# Patient Record
Sex: Female | Born: 1985 | Race: White | Hispanic: No | Marital: Married | Smoking: Former smoker
Health system: Southern US, Community
[De-identification: ages and names within clinical notes are randomized; demographics above are authoritative.]

## PROBLEM LIST (undated history)

## (undated) DIAGNOSIS — F32A Depression, unspecified: Secondary | ICD-10-CM

## (undated) DIAGNOSIS — F988 Other specified behavioral and emotional disorders with onset usually occurring in childhood and adolescence: Secondary | ICD-10-CM

## (undated) DIAGNOSIS — F419 Anxiety disorder, unspecified: Secondary | ICD-10-CM

## (undated) DIAGNOSIS — F329 Major depressive disorder, single episode, unspecified: Secondary | ICD-10-CM

---

## 2016-07-15 ENCOUNTER — Ambulatory Visit
Admission: EM | Admit: 2016-07-15 | Discharge: 2016-07-15 | Disposition: A | Discharge status: Home / Self Care | Payer: Medicaid Other | Attending: Family Medicine | Admitting: Family Medicine

## 2016-07-15 DIAGNOSIS — F17210 Nicotine dependence, cigarettes, uncomplicated: Secondary | ICD-10-CM | POA: Diagnosis not present

## 2016-07-15 DIAGNOSIS — N390 Urinary tract infection, site not specified: Secondary | ICD-10-CM | POA: Diagnosis not present

## 2016-07-15 DIAGNOSIS — F329 Major depressive disorder, single episode, unspecified: Secondary | ICD-10-CM | POA: Insufficient documentation

## 2016-07-15 DIAGNOSIS — R3129 Other microscopic hematuria: Secondary | ICD-10-CM | POA: Diagnosis not present

## 2016-07-15 DIAGNOSIS — Z9889 Other specified postprocedural states: Secondary | ICD-10-CM | POA: Insufficient documentation

## 2016-07-15 DIAGNOSIS — F419 Anxiety disorder, unspecified: Secondary | ICD-10-CM | POA: Insufficient documentation

## 2016-07-15 DIAGNOSIS — Z79899 Other long term (current) drug therapy: Secondary | ICD-10-CM | POA: Diagnosis not present

## 2016-07-15 HISTORY — DX: Depression, unspecified: F32.A

## 2016-07-15 HISTORY — DX: Major depressive disorder, single episode, unspecified: F32.9

## 2016-07-15 HISTORY — DX: Other specified behavioral and emotional disorders with onset usually occurring in childhood and adolescence: F98.8

## 2016-07-15 HISTORY — DX: Anxiety disorder, unspecified: F41.9

## 2016-07-15 LAB — URINALYSIS COMPLETE WITH MICROSCOPIC (ARMC ONLY)
BILIRUBIN URINE: NEGATIVE
Glucose, UA: NEGATIVE mg/dL
KETONES UR: NEGATIVE mg/dL
NITRITE: NEGATIVE
PH: 5.5 (ref 5.0–8.0)
PROTEIN: NEGATIVE mg/dL

## 2016-07-15 MED ORDER — PHENAZOPYRIDINE HCL 200 MG PO TABS: 200.0000 mg | ORAL_TABLET | Freq: Three times a day (TID) | ORAL | 0 refills | Status: DC

## 2016-07-15 MED ORDER — SULFAMETHOXAZOLE-TRIMETHOPRIM 800-160 MG PO TABS: 1.0000 | ORAL_TABLET | Freq: Two times a day (BID) | ORAL | 0 refills | Status: DC

## 2016-07-15 NOTE — ED Triage Notes (Signed)
Started yesterday with frequency and burning with urination starting yesterday. Pain 2/10 currently. Denies back pain.

## 2016-07-15 NOTE — ED Provider Notes (Signed)
CSN: [id number]     Arrival date & time 07/15/16  1515 History   First MD Initiated Contact with Patient 07/15/16 1553     Chief Complaint  Patient presents with  . Urinary Frequency   (Consider location/radiation/quality/duration/timing/severity/associated sxs/prior Treatment) Single caucasian female here for urinary frequency and burning x 2 days.  Last UTI years ago.  Denied personal or family history kidney stones.  Denied gross hematuria, changes in smell or color of urine, back pain, headache, diarrhea.  +low abdomen pain      Past Medical History:  Diagnosis Date  . ADD (attention deficit disorder)   . Anxiety   . Depression    Past Surgical History:  Procedure Laterality Date  . CESAREAN SECTION     History reviewed. No pertinent family history. Social History  Substance Use Topics  . Smoking status: Current Every Day Smoker    Packs/day: 0.50    Types: Cigarettes  . Smokeless tobacco: Never Used  . Alcohol use Yes     Comment: social   OB History    No data available     Review of Systems  Constitutional: Negative for activity change, appetite change, chills, diaphoresis, fatigue and fever.  HENT: Negative for congestion, ear pain and sore throat.   Eyes: Negative for photophobia, pain, discharge, redness, itching and visual disturbance.  Respiratory: Negative for cough, shortness of breath, wheezing and stridor.   Cardiovascular: Negative for chest pain, palpitations and leg swelling.  Gastrointestinal: Positive for abdominal pain. Negative for abdominal distention, anal bleeding, blood in stool, constipation, diarrhea, nausea, rectal pain and vomiting.  Endocrine: Positive for polyuria. Negative for polydipsia and polyphagia.  Genitourinary: Negative for dysuria and hematuria.  Musculoskeletal: Negative for arthralgias, back pain, gait problem, joint swelling, myalgias, neck pain and neck stiffness.  Skin: Negative for color change, rash and wound.   Allergic/Immunologic: Negative for environmental allergies and food allergies.  Neurological: Negative for dizziness, tremors, seizures, syncope, facial asymmetry, speech difficulty, weakness, light-headedness, numbness and headaches.  Hematological: Negative for adenopathy. Does not bruise/bleed easily.  Psychiatric/Behavioral: Negative for sleep disturbance.  All other systems reviewed and are negative.   Allergies  Review of patient's allergies indicates no known allergies.  Home Medications   Prior to Admission medications   Medication Sig Start Date End Date Taking? Authorizing Provider  ALPRAZolam Prudy Feeler(XANAX) 0.5 MG tablet Take 0.5 mg by mouth at bedtime as needed for anxiety.   Yes Historical Provider, MD  amphetamine-dextroamphetamine (ADDERALL) 20 MG tablet Take 20 mg by mouth daily.   Yes Historical Provider, MD  citalopram (CELEXA) 40 MG tablet Take 40 mg by mouth daily.   Yes Historical Provider, MD  Melatonin 5 MG TABS Take by mouth.   Yes Historical Provider, MD  phenazopyridine (PYRIDIUM) 200 MG tablet Take 1 tablet (200 mg total) by mouth 3 (three) times daily. 07/15/16   Barbaraann Barthelina A Camilo Mander, NP  sulfamethoxazole-trimethoprim (BACTRIM DS,SEPTRA DS) 800-160 MG tablet Take 1 tablet by mouth 2 (two) times daily. 07/15/16   Barbaraann Barthelina A Chelsea Nusz, NP   Meds Ordered and Administered this Visit  Medications - No data to display  BP 133/88   Pulse 72   Temp 97.3 F (36.3 C) (Tympanic)   Resp 18   Ht 5\' 7"  (1.702 m)   Wt 197 lb (89.4 kg)   SpO2 100%   BMI 30.85 kg/m  No data found.   Physical Exam  Constitutional: She appears well-developed and well-nourished. No distress.  HENT:  Head:  Normocephalic and atraumatic.  Right Ear: External ear normal.  Left Ear: External ear normal.  Nose: Nose normal.  Mouth/Throat: Oropharynx is clear and moist. No oropharyngeal exudate.  Eyes: Conjunctivae and EOM are normal. Pupils are equal, round, and reactive to light. Right eye exhibits  no discharge. Left eye exhibits no discharge. No scleral icterus.  Neck: Normal range of motion. Neck supple. No tracheal deviation present. No thyromegaly present.  Cardiovascular: Normal rate and regular rhythm.   No murmur heard. Pulmonary/Chest: Effort normal and breath sounds normal. No stridor. No respiratory distress. She has no wheezes. She has no rales.  Abdominal: Soft. She exhibits no distension and no mass. There is tenderness. There is no rebound and no guarding. No hernia.  Musculoskeletal: Normal range of motion. She exhibits no edema, tenderness or deformity.  Lymphadenopathy:    She has no cervical adenopathy.  Neurological: She is alert.  Skin: Skin is warm and dry. Capillary refill takes less than 2 seconds. No rash noted. She is not diaphoretic. No erythema. No pallor.  Psychiatric: She has a normal mood and affect. Her behavior is normal. Judgment and thought content normal.  Nursing note and vitals reviewed.   Urgent Care Course   Clinical Course    Procedures (including critical care time)  Labs Review Labs Reviewed  URINALYSIS COMPLETEWITH MICROSCOPIC ([facility] ONLY) - Abnormal; Notable for the following:       Result Value   Color, Urine STRAW (*)    Specific Gravity, Urine <1.005 (*)    Hgb urine dipstick SMALL (*)    Leukocytes, UA MODERATE (*)    Bacteria, UA RARE (*)    Squamous Epithelial / LPF 0-5 (*)    All other components within normal limits  URINE CULTURE    Imaging Review No results found.  1602 urinalysis results pending patient sipping water in exam room  1625 reviewed + blood and bacteria urinalysis results with patient and given copy.  Discussed urine culture results typically available 48 hours will call with results once available.  Patient verbalized understanding information/instructions, agreed with plan of care and had no further questions at this time.  MDM   1. UTI (lower urinary tract infection)   2. Microscopic hematuria     Medications as directed.  Bactrim ds po BID x 7 days.  Rx given. Ph lower than 6 so held on macrobid.  Patient is also to push fluids and may use Pyridium 200mg  po TID as needed x 48 hours.  Hydrate, avoid dehydration.  Avoid holding urine void on frequent basis every 4 to 6 hours.  If unable to void every 8 hours follow up for re-evaluation with PCM, urgent care or ER.   Call or return to clinic as needed if these symptoms worsen or fail to improve as anticipated.  Exitcare handout on cystitis, urinalysis given to patient Patient verbalized agreement and understanding of treatment plan and had no further questions at this time. P2:  Hydrate and cranberry juice    Barbaraann Barthel, NP 07/15/16 1726

## 2016-07-18 LAB — URINE CULTURE
Culture: >=100000 — AB
SPECIAL REQUESTS: NORMAL

## 2016-07-21 ENCOUNTER — Telehealth: Payer: Self-pay | Admitting: Internal Medicine

## 2016-07-21 NOTE — Telephone Encounter (Signed)
Urine culture 100,000 e.coli no resistance noted to antibiotics.  Patient to finish her Rx.  Notified via telephone she stated she was feeling better, verbalized understanding information/instructions, agreed with plan of care and had no futher questions at this time.

## 2016-07-22 ENCOUNTER — Telehealth (HOSPITAL_COMMUNITY): Payer: Self-pay | Admitting: Emergency Medicine

## 2016-07-22 NOTE — Telephone Encounter (Signed)
-----   Message from Stacey MooreLaura W Murray, MD sent at 07/18/2016  9:58 PM EDT ----- Please let patient know that urine culture was positive for E coli sensitive to trimethoprim/sulfa.  [facility]  Recheck or followup PCP Jola BabinskiWalter Fowler for further evaluation if symptoms persist.  LM

## 2016-07-22 NOTE — Telephone Encounter (Signed)
LM on pt's VM Need to see how pt is doing and to give lab results from recent visit on 9/13 Also let pt know labs can be obtained from MyChart  Pt has been notified by Albina Billetina Betancourt, NP per telephone note on 9/19

## 2019-08-17 ENCOUNTER — Other Ambulatory Visit: Payer: Self-pay

## 2019-08-17 ENCOUNTER — Ambulatory Visit: Payer: Medicaid Other

## 2019-08-17 ENCOUNTER — Ambulatory Visit
Admission: EM | Admit: 2019-08-17 | Discharge: 2019-08-17 | Disposition: A | Discharge status: Home / Self Care | Payer: Medicaid Other | Attending: Family Medicine | Admitting: Family Medicine

## 2019-08-17 DIAGNOSIS — S62102A Fracture of unspecified carpal bone, left wrist, initial encounter for closed fracture: Secondary | ICD-10-CM | POA: Diagnosis not present

## 2019-08-17 DIAGNOSIS — W19XXXA Unspecified fall, initial encounter: Secondary | ICD-10-CM | POA: Diagnosis not present

## 2019-08-17 LAB — URINALYSIS, COMPLETE (UACMP) WITH MICROSCOPIC
Bilirubin Urine: NEGATIVE
Glucose, UA: NEGATIVE mg/dL
Ketones, ur: NEGATIVE mg/dL
Leukocytes,Ua: NEGATIVE
Nitrite: NEGATIVE
Protein, ur: NEGATIVE mg/dL
Specific Gravity, Urine: 1.02 (ref 1.005–1.030)
pH: 6 (ref 5.0–8.0)

## 2019-08-17 MED ORDER — IBUPROFEN 800 MG PO TABS: 800.0000 mg | ORAL_TABLET | Freq: Three times a day (TID) | ORAL | 0 refills | Status: AC | PRN

## 2019-08-17 NOTE — Discharge Instructions (Addendum)
Rest, ice, elevation.  Ibuprofen as directed.   Splint for 2 weeks.  Take care  Dr. Lacinda Axon

## 2019-08-17 NOTE — ED Triage Notes (Signed)
Patient states that she fell last night. Patient reports that she has been having pain in left thumb since, swollen.

## 2019-08-17 NOTE — ED Provider Notes (Signed)
[facility]    CSN: [id number] Arrival date & time: 08/17/19  1544  History   Chief Complaint Chief Complaint  Patient presents with  . Fall  . Finger Injury    left thumb   HPI  33 year old female presents with a left thumb injury.  Patient recently delivered on 10/2.  Patient states that she had put her baby down and subsequently tripped on her feet and fell forward on outstretched hand.  She reports left thumb pain.  Associated swelling particular at the thenar eminence.  Rates her pain as 2/10 in severity.  Worse with palpation.  No relieving factors.  No other associated symptoms.  No other complaints.   PMH, Surgical Hx, Family Hx, Social History reviewed and updated as below.  Past Medical History:  Diagnosis Date  . ADD (attention deficit disorder)   . Anxiety   . Depression   GERD Hx of IUGR  Past Surgical History:  Procedure Laterality Date  . CESAREAN SECTION     OB History   No obstetric history on file.    Home Medications    Prior to Admission medications   Medication Sig Start Date End Date Taking? Authorizing Provider  citalopram (CELEXA) 20 MG tablet Take 20 mg by mouth daily.    Yes [provider]  labetalol (NORMODYNE) 200 MG tablet Take by mouth. 08/06/19 08/05/20 Yes [provider]  Melatonin 5 MG TABS Take by mouth.   Yes [provider]  ibuprofen (ADVIL) 800 MG tablet Take 1 tablet (800 mg total) by mouth every 8 (eight) hours as needed for moderate pain. 08/17/19   Coral Spikes, DO  amphetamine-dextroamphetamine (ADDERALL) 20 MG tablet Take 20 mg by mouth daily.  08/17/19  [provider]   Social History Social History   Tobacco Use  . Smoking status: Former Smoker    Packs/day: 0.50    Types: Cigarettes  . Smokeless tobacco: Never Used  Substance Use Topics  . Alcohol use: Yes    Comment: social  . Drug use: No     Allergies   Patient has no known allergies.   Review of  Systems Review of Systems  Constitutional: Negative.   Musculoskeletal:       Thumb pain, swelling, injury.   Physical Exam Triage Vital Signs ED Triage Vitals  Enc Vitals Group     BP 08/17/19 1608 (!) 126/93     Pulse Rate 08/17/19 1608 78     Resp 08/17/19 1608 18     Temp 08/17/19 1608 99.3 F (37.4 C)     Temp Source 08/17/19 1608 Oral     SpO2 08/17/19 1608 100 %     Weight --      Height 08/17/19 1604 5\' 7"  (1.702 m)     Head Circumference --      Peak Flow --      Pain Score 08/17/19 1604 2     Pain Loc --      Pain Edu? --      Excl. in [place]? --    Updated Vital Signs BP (!) 126/93 (BP Location: Left Arm)   Pulse 78   Temp 99.3 F (37.4 C) (Oral)   Resp 18   Ht 5\' 7"  (1.702 m)   SpO2 100%   BMI 30.85 kg/m   Visual Acuity Right Eye Distance:   Left Eye Distance:   Bilateral Distance:    Right Eye Near:   Left Eye Near:  Bilateral Near:     Physical Exam Vitals signs and nursing note reviewed.  Constitutional:      General: She is not in acute distress.    Appearance: Normal appearance. She is not ill-appearing.  HENT:     Head: Normocephalic and atraumatic.  Eyes:     General:        Right eye: No discharge.        Left eye: No discharge.     Conjunctiva/sclera: Conjunctivae normal.  Cardiovascular:     Rate and Rhythm: Normal rate and regular rhythm.     Heart sounds: No murmur.  Pulmonary:     Effort: Pulmonary effort is normal.     Breath sounds: Normal breath sounds. No wheezing, rhonchi or rales.  Musculoskeletal:     Comments: Left thumb.  Swelling noted at the thenar eminence.  Tenderness palpation at the MCP joint.  Neurological:     Mental Status: She is alert.  Psychiatric:        Mood and Affect: Mood normal.        Behavior: Behavior normal.    UC Treatments / Results  Labs (all labs ordered are listed, but only abnormal results are displayed) Labs Reviewed  URINALYSIS, COMPLETE (UACMP) WITH MICROSCOPIC    EKG    Radiology Dg Finger Thumb Left  Result Date: 08/17/2019 CLINICAL DATA:  Left thumb pain and swelling.  Fall last night. EXAM: LEFT THUMB 2+V COMPARISON:  None. FINDINGS: There is a mildly displaced fracture of the ulnar sesamoid bone at the first MCP joint. The first metacarpal and phalanges of the thumb appear intact. There is no dislocation. Mild soft tissue swelling is noted about the first MCP joint. IMPRESSION: Fracture of the ulnar sesamoid of the thumb. Electronically Signed   By: Sebastian Ache M.D.   On: 08/17/2019 16:46    Procedures Procedures (including critical care time)  Medications Ordered in UC Medications - No data to display  Initial Impression / Assessment and Plan / UC Course  I have reviewed the triage vital signs and the nursing notes.  Pertinent labs & imaging results that were available during my care of the patient were reviewed by me and considered in my medical decision making (see chart for details).    33 year old female presents with a fracture of her sesamoid of the left thumb.  Placed in thumb spica.  Ibuprofen as directed.  Additionally, after I informed the patient regarding her fracture and treatment plan, she endorsed some painful urination.  Urinalysis was obtained.  Results pending at this time.  Final Clinical Impressions(s) / UC Diagnoses   Final diagnoses:  Closed fracture of sesamoid bone of left hand, initial encounter     Discharge Instructions     Rest, ice, elevation.  Ibuprofen as directed.   Splint for 2 weeks.  Take care  Dr. Adriana Simas     ED Prescriptions    Medication Sig Dispense Auth. Provider   ibuprofen (ADVIL) 800 MG tablet Take 1 tablet (800 mg total) by mouth every 8 (eight) hours as needed for moderate pain. 30 tablet Tommie Sams, DO     PDMP not reviewed this encounter.   Tommie Sams, [place] 08/17/19 1721

## 2019-08-18 LAB — URINE CULTURE

## 2020-07-26 ENCOUNTER — Ambulatory Visit
Admission: EM | Admit: 2020-07-26 | Discharge: 2020-07-26 | Disposition: A | Discharge status: Home / Self Care | Payer: Medicaid Other

## 2020-07-26 DIAGNOSIS — H00015 Hordeolum externum left lower eyelid: Secondary | ICD-10-CM

## 2020-07-26 MED ORDER — TOBRAMYCIN 0.3 % OP SOLN: 1.0000 [drp] | Freq: Four times a day (QID) | OPHTHALMIC | 0 refills | Status: AC

## 2020-07-26 NOTE — ED Triage Notes (Signed)
Patient in today w/ c/o stye on her left eye.

## 2020-07-26 NOTE — ED Provider Notes (Signed)
[facility]    CSN: [id number] Arrival date & time: 07/26/20  1332      History   Chief Complaint Chief Complaint  Patient presents with  . Stye    Left eye    HPI Stacey Goodman is a 34 y.o. female.   Stacey Goodman presents with complaints of crusting and painful redness and swelling to left lower lid. Noted today. Has had previous similar, has required antibiotic drops. No vision changes. No fevers. Doesn't wear contacts or glasses. Just started using a new underye moisturizer, no other known irritant or exposure.    ROS per HPI, negative if not otherwise mentioned.      Past Medical History:  Diagnosis Date  . ADD (attention deficit disorder)   . Anxiety   . Depression     There are no problems to display for this patient.   Past Surgical History:  Procedure Laterality Date  . CESAREAN SECTION      OB History   No obstetric history on file.      Home Medications    Prior to Admission medications   Medication Sig Start Date End Date Taking? Authorizing Provider  amphetamine-dextroamphetamine (ADDERALL XR) 20 MG 24 hr capsule Take by mouth. 07/02/20 08/01/20 Yes [provider]  citalopram (CELEXA) 20 MG tablet Take 20 mg by mouth daily.    Yes [provider]  ibuprofen (ADVIL) 800 MG tablet Take 1 tablet (800 mg total) by mouth every 8 (eight) hours as needed for moderate pain. 08/17/19  Yes Cook, Jayce G, DO  Melatonin 5 MG TABS Take by mouth.   Yes [provider]  labetalol (NORMODYNE) 200 MG tablet Take by mouth. 08/06/19 08/05/20  [provider]  tobramycin (TOBREX) 0.3 % ophthalmic solution Place 1 drop into the left eye every 6 (six) hours for 5 days. 07/26/20 07/31/20  Georgetta Haber, NP    Family History History reviewed. No pertinent family history.  Social History Social History   Tobacco Use  . Smoking status: Former Smoker    Packs/day: 0.50    Types: Cigarettes  .  Smokeless tobacco: Never Used  Substance Use Topics  . Alcohol use: Yes    Comment: social  . Drug use: No     Allergies   Patient has no known allergies.   Review of Systems Review of Systems   Physical Exam Triage Vital Signs ED Triage Vitals  Enc Vitals Group     BP 07/26/20 1351 (!) 177/129     Pulse Rate 07/26/20 1351 91     Resp 07/26/20 1351 16     Temp 07/26/20 1351 98.5 F (36.9 C)     Temp Source 07/26/20 1351 Oral     SpO2 07/26/20 1351 99 %     Weight 07/26/20 1354 230 lb (104.3 kg)     Height 07/26/20 1354 5\' 7"  (1.702 m)     Head Circumference --      Peak Flow --      Pain Score 07/26/20 1353 2     Pain Loc --      Pain Edu? --      Excl. in [place]? --    No data found.  Updated Vital Signs BP (!) 177/129 (BP Location: Left Arm)   Pulse 91   Temp 98.5 F (36.9 C) (Oral)   Resp 16   Ht 5\' 7"  (1.702 m)   Wt 230 lb (104.3 kg)   LMP  06/25/2020 (Approximate)   SpO2 99%   Breastfeeding No   BMI 36.02 kg/m   Visual Acuity Right Eye Distance:   Left Eye Distance:   Bilateral Distance:    Right Eye Near:   Left Eye Near:    Bilateral Near:     Physical Exam Constitutional:      General: She is not in acute distress.    Appearance: She is well-developed.  Eyes:     General:        Left eye: Hordeolum present.  Cardiovascular:     Rate and Rhythm: Normal rate.  Pulmonary:     Effort: Pulmonary effort is normal.  Skin:    General: Skin is warm and dry.  Neurological:     Mental Status: She is alert and oriented to person, place, and time.      UC Treatments / Results  Labs (all labs ordered are listed, but only abnormal results are displayed) Labs Reviewed - No data to display  EKG   Radiology No results found.  Procedures Procedures (including critical care time)  Medications Ordered in UC Medications - No data to display  Initial Impression / Assessment and Plan / UC Course  I have reviewed the triage vital signs and  the nursing notes.  Pertinent labs & imaging results that were available during my care of the patient were reviewed by me and considered in my medical decision making (see chart for details).     Left lower lid stye. Drops provided to decrease bacterial load. Warm compresses recommended. Return precautions provided. Patient verbalized understanding and agreeable to plan.   Final Clinical Impressions(s) / UC Diagnoses   Final diagnoses:  Hordeolum externum of left lower eyelid     Discharge Instructions     Warm compresses every few hours to help with symptoms.  Wash lash line with soap and water and wash cloth to cleanse thoroughly. Avoid rubbing the eyes. Discard any older eye makes- liner mascara etc Please follow up with your primary care provider for recheck of your blood pressure.     ED Prescriptions    Medication Sig Dispense Auth. Provider   tobramycin (TOBREX) 0.3 % ophthalmic solution Place 1 drop into the left eye every 6 (six) hours for 5 days. 5 mL Georgetta Haber, NP     PDMP not reviewed this encounter.   Georgetta Haber, NP 07/26/20 1415

## 2020-07-26 NOTE — Discharge Instructions (Signed)
Warm compresses every few hours to help with symptoms.  Wash lash line with soap and water and wash cloth to cleanse thoroughly. Avoid rubbing the eyes. Discard any older eye makes- liner mascara etc Please follow up with your primary care provider for recheck of your blood pressure.

## 2020-08-08 ENCOUNTER — Other Ambulatory Visit: Payer: Self-pay

## 2020-08-08 ENCOUNTER — Ambulatory Visit
Admission: EM | Admit: 2020-08-08 | Discharge: 2020-08-08 | Disposition: A | Discharge status: Home / Self Care | Payer: Medicaid Other | Attending: Internal Medicine | Admitting: Internal Medicine

## 2020-08-08 DIAGNOSIS — R030 Elevated blood-pressure reading, without diagnosis of hypertension: Secondary | ICD-10-CM | POA: Diagnosis present

## 2020-08-08 DIAGNOSIS — R053 Chronic cough: Secondary | ICD-10-CM

## 2020-08-08 DIAGNOSIS — Z20822 Contact with and (suspected) exposure to covid-19: Secondary | ICD-10-CM | POA: Diagnosis present

## 2020-08-08 LAB — SARS CORONAVIRUS 2 (TAT 6-24 HRS): SARS Coronavirus 2: NEGATIVE

## 2020-08-08 NOTE — ED Provider Notes (Signed)
[facility]    CSN: [id number] Arrival date & time: 08/08/20  2952      History   Chief Complaint Chief Complaint  Patient presents with  . Cough    HPI Stacey Goodman is a 34 y.o. female presenting for Covid testing following an exposure by her daughter about 2 weeks ago.  She states that her daughter and ex-husband tested positive and her daughter quarantined.  She says that a few family members want her to get tested before they visit her.  Patient does have a history of chronic cough due to smoking and denies any change in the cough recently.  She denies any other symptoms.  Patient has been fully vaccinated for Covid.  Patient denies fever, fatigue, body aches, nasal congestion, sore throat, smell or taste changes, chest pain, breathing difficulty abdominal pain, nausea, vomiting, diarrhea.  Patient admittedly has high blood pressure readings and has been following up with her PCP regarding this.  Patient states that she is trying to stop smoking and lose weight to help control blood pressure instead of going on medication at this time.  Patient has no other complaints or concerns at this time.  HPI  Past Medical History:  Diagnosis Date  . ADD (attention deficit disorder)   . Anxiety   . Depression     There are no problems to display for this patient.   Past Surgical History:  Procedure Laterality Date  . CESAREAN SECTION      OB History   No obstetric history on file.      Home Medications    Prior to Admission medications   Medication Sig Start Date End Date Taking? Authorizing Provider  amphetamine-dextroamphetamine (ADDERALL XR) 20 MG 24 hr capsule Take by mouth. 07/02/20 08/01/20  [provider]  citalopram (CELEXA) 20 MG tablet Take 20 mg by mouth daily.     [provider]  ibuprofen (ADVIL) 800 MG tablet Take 1 tablet (800 mg total) by mouth every 8 (eight) hours as needed for moderate pain. 08/17/19   Tommie Sams,  DO  labetalol (NORMODYNE) 200 MG tablet Take by mouth. 08/06/19 08/05/20  [provider]  Melatonin 5 MG TABS Take by mouth.    [provider]    Family History History reviewed. No pertinent family history.  Social History Social History   Tobacco Use  . Smoking status: Former Smoker    Packs/day: 0.50    Types: Cigarettes  . Smokeless tobacco: Never Used  Substance Use Topics  . Alcohol use: Yes    Comment: social  . Drug use: No     Allergies   Patient has no known allergies.   Review of Systems Review of Systems  Constitutional: Negative for chills, diaphoresis, fatigue and fever.  HENT: Negative for congestion, ear pain, rhinorrhea, sinus pressure, sinus pain and sore throat.   Respiratory: Positive for cough. Negative for shortness of breath.   Gastrointestinal: Negative for abdominal pain, nausea and vomiting.  Musculoskeletal: Negative for arthralgias and myalgias.  Skin: Negative for rash.  Neurological: Negative for weakness and headaches.  Hematological: Negative for adenopathy.     Physical Exam Triage Vital Signs ED Triage Vitals  Enc Vitals Group     BP 08/08/20 0846 (!) 173/129     Pulse Rate 08/08/20 0846 95     Resp 08/08/20 0846 18     Temp 08/08/20 0846 98.6 F (37 C)     Temp Source 08/08/20 0846  Oral     SpO2 08/08/20 0846 100 %     Weight 08/08/20 0848 229 lb 15 oz (104.3 kg)     Height 08/08/20 0848 5\' 7"  (1.702 m)     Head Circumference --      Peak Flow --      Pain Score 08/08/20 0848 0     Pain Loc --      Pain Edu? --      Excl. in GC? --    No data found.  Updated Vital Signs BP (!) 169/122   Pulse 95   Temp 98.6 F (37 C) (Oral)   Resp 18   Ht 5\' 7"  (1.702 m)   Wt 229 lb 15 oz (104.3 kg)   LMP 08/05/2020   SpO2 100%   BMI 36.01 kg/m       Physical Exam Vitals and nursing note reviewed.  Constitutional:      General: She is not in acute distress.    Appearance: Normal appearance. She is  obese. She is not ill-appearing or toxic-appearing.  HENT:     Head: Normocephalic and atraumatic.     Nose: Nose normal.     Mouth/Throat:     Mouth: Mucous membranes are moist.     Pharynx: Oropharynx is clear.  Eyes:     General: No scleral icterus.       Right eye: No discharge.        Left eye: No discharge.     Conjunctiva/sclera: Conjunctivae normal.  Cardiovascular:     Rate and Rhythm: Normal rate and regular rhythm.     Heart sounds: Normal heart sounds.  Pulmonary:     Effort: Pulmonary effort is normal. No respiratory distress.     Breath sounds: Normal breath sounds.  Musculoskeletal:     Cervical back: Neck supple.  Skin:    General: Skin is dry.  Neurological:     General: No focal deficit present.     Mental Status: She is alert. Mental status is at baseline.     Motor: No weakness.     Gait: Gait normal.  Psychiatric:        Mood and Affect: Mood normal.        Behavior: Behavior normal.        Thought Content: Thought content normal.      UC Treatments / Results  Labs (all labs ordered are listed, but only abnormal results are displayed) Labs Reviewed  SARS CORONAVIRUS 2 (TAT 6-24 HRS)    EKG   Radiology No results found.  Procedures Procedures (including critical care time)  Medications Ordered in UC Medications - No data to display  Initial Impression / Assessment and Plan / UC Course  I have reviewed the triage vital signs and the nursing notes.  Pertinent labs & imaging results that were available during my care of the patient were reviewed by me and considered in my medical decision making (see chart for details).   Covid testing obtained today.  CDC guidelines, isolation protocol and ED precautions discussed.  Advised we will call if test is positive.  Otherwise she can follow up with the MyChart to find her test result.  Advised her that she will need to isolate 10 days from today if her test is positive.  BP elevated on both  checks in the clinic today.  Advised patient to continue to follow-up with PCP regarding this.  Encouraged her to keep a log of blood pressure  and follow-up closely about this as she may need medication management until she is able to lose weight and cease smoking.  Patient agrees.   Final Clinical Impressions(s) / UC Diagnoses   Final diagnoses:  Exposure to COVID-19 virus  Chronic cough  Elevated blood pressure reading without diagnosis of hypertension     Discharge Instructions     You have received COVID testing today either for positive exposure, concerning symptoms that could be related to COVID infection, screening purposes, or re-testing after confirmed positive.  Your test obtained today checks for active viral infection in the last 1-2 weeks. If your test is negative now, you can still test positive later. So, if you do develop symptoms you should either get re-tested and/or isolate x 10 days. Please follow CDC guidelines.  While Rapid antigen tests come back in 15-20 minutes, send out PCR/molecular test results typically come back within 24 hours. In the mean time, if you are symptomatic, assume this could be a positive test and treat/monitor yourself as if you do have COVID.   We will call with test results. Please download the MyChart app and set up a profile to access test results.   If symptomatic, go home and rest. Push fluids. Take Tylenol as needed for discomfort. Gargle warm salt water. Throat lozenges. Take Mucinex DM or Robitussin for cough. Humidifier in bedroom to ease coughing. Warm showers. Also review the COVID handout for more information.  COVID-19 INFECTION: The incubation period of COVID-19 is approximately 14 days after exposure, with most symptoms developing in roughly 4-5 days. Symptoms may range in severity from mild to critically severe. Roughly 80% of those infected will have mild symptoms. People of any age may become infected with COVID-19 and have the  ability to transmit the virus. The most common symptoms include: fever, fatigue, cough, body aches, headaches, sore throat, nasal congestion, shortness of breath, nausea, vomiting, diarrhea, changes in smell and/or taste.    COURSE OF ILLNESS Some patients may begin with mild disease which can progress quickly into critical symptoms. If your symptoms are worsening please call ahead to the Emergency Department and proceed there for further treatment. Recovery time appears to be roughly 1-2 weeks for mild symptoms and 3-6 weeks for severe disease.   GO IMMEDIATELY TO ER FOR FEVER YOU ARE UNABLE TO GET DOWN WITH TYLENOL, BREATHING PROBLEMS, CHEST PAIN, FATIGUE, LETHARGY, INABILITY TO EAT OR DRINK, ETC  QUARANTINE AND ISOLATION: To help decrease the spread of COVID-19 please remain isolated if you have COVID infection or are highly suspected to have COVID infection. This means -stay home and isolate to one room in the home if you live with others. Do not share a bed or bathroom with others while ill, sanitize and wipe down all countertops and keep common areas clean and disinfected. You may discontinue isolation if you have a mild case and are asymptomatic 10 days after symptom onset as long as you have been fever free >24 hours without having to take Motrin or Tylenol. If your case is more severe (meaning you develop pneumonia or are admitted in the hospital), you may have to isolate longer.   If you have been in close contact (within 6 feet) of someone diagnosed with COVID 19, you are advised to quarantine in your home for 14 days as symptoms can develop anywhere from 2-14 days after exposure to the virus. If you develop symptoms, you  must isolate.  Most current guidelines for COVID after exposure -isolate  10 days if you ARE NOT tested for COVID as long as symptoms do not develop -isolate 7 days if you are tested and remain asymptomatic -You do not necessarily need to be tested for COVID if you have +  exposure and        develop   symptoms. Just isolate at home x10 days from symptom onset During this global pandemic, CDC advises to practice social distancing, try to stay at least 156ft away from others at all times. Wear a face covering. Wash and sanitize your hands regularly and avoid going anywhere that is not necessary.  KEEP IN MIND THAT THE COVID TEST IS NOT 100% ACCURATE AND YOU SHOULD STILL DO EVERYTHING TO PREVENT POTENTIAL SPREAD OF VIRUS TO OTHERS (WEAR MASK, WEAR GLOVES, WASH HANDS AND SANITIZE REGULARLY). IF INITIAL TEST IS NEGATIVE, THIS MAY NOT MEAN YOU ARE DEFINITELY NEGATIVE. MOST ACCURATE TESTING IS DONE 5-7 DAYS AFTER EXPOSURE.   It is not advised by CDC to get re-tested after receiving a positive COVID test since you can still test positive for weeks to months after you have already cleared the virus.   *If you have not been vaccinated for COVID, I strongly suggest you consider getting vaccinated as long as there are no contraindications.    Your blood pressure is elevated in clinic today please continue to follow-up with your PCP regarding control of your blood pressure.  Try to keep a check on it at home and follow-up with your PCP when you have a recorded log of the blood pressures.    ED Prescriptions    None     PDMP not reviewed this encounter.   Shirlee Latchaves, Tim Wilhide B, PA-C 08/08/20 [phone]

## 2020-08-08 NOTE — Discharge Instructions (Addendum)
You have received COVID testing today either for positive exposure, concerning symptoms that could be related to COVID infection, screening purposes, or re-testing after confirmed positive.  Your test obtained today checks for active viral infection in the last 1-2 weeks. If your test is negative now, you can still test positive later. So, if you do develop symptoms you should either get re-tested and/or isolate x 10 days. Please follow CDC guidelines.  While Rapid antigen tests come back in 15-20 minutes, send out PCR/molecular test results typically come back within 24 hours. In the mean time, if you are symptomatic, assume this could be a positive test and treat/monitor yourself as if you do have COVID.   We will call with test results. Please download the MyChart app and set up a profile to access test results.   If symptomatic, go home and rest. Push fluids. Take Tylenol as needed for discomfort. Gargle warm salt water. Throat lozenges. Take Mucinex DM or Robitussin for cough. Humidifier in bedroom to ease coughing. Warm showers. Also review the COVID handout for more information.  COVID-19 INFECTION: The incubation period of COVID-19 is approximately 14 days after exposure, with most symptoms developing in roughly 4-5 days. Symptoms may range in severity from mild to critically severe. Roughly 80% of those infected will have mild symptoms. People of any age may become infected with COVID-19 and have the ability to transmit the virus. The most common symptoms include: fever, fatigue, cough, body aches, headaches, sore throat, nasal congestion, shortness of breath, nausea, vomiting, diarrhea, changes in smell and/or taste.    COURSE OF ILLNESS Some patients may begin with mild disease which can progress quickly into critical symptoms. If your symptoms are worsening please call ahead to the Emergency Department and proceed there for further treatment. Recovery time appears to be roughly 1-2 weeks for  mild symptoms and 3-6 weeks for severe disease.   GO IMMEDIATELY TO ER FOR FEVER YOU ARE UNABLE TO GET DOWN WITH TYLENOL, BREATHING PROBLEMS, CHEST PAIN, FATIGUE, LETHARGY, INABILITY TO EAT OR DRINK, ETC  QUARANTINE AND ISOLATION: To help decrease the spread of COVID-19 please remain isolated if you have COVID infection or are highly suspected to have COVID infection. This means -stay home and isolate to one room in the home if you live with others. Do not share a bed or bathroom with others while ill, sanitize and wipe down all countertops and keep common areas clean and disinfected. You may discontinue isolation if you have a mild case and are asymptomatic 10 days after symptom onset as long as you have been fever free >24 hours without having to take Motrin or Tylenol. If your case is more severe (meaning you develop pneumonia or are admitted in the hospital), you may have to isolate longer.   If you have been in close contact (within 6 feet) of someone diagnosed with COVID 19, you are advised to quarantine in your home for 14 days as symptoms can develop anywhere from 2-14 days after exposure to the virus. If you develop symptoms, you  must isolate.  Most current guidelines for COVID after exposure -isolate 10 days if you ARE NOT tested for COVID as long as symptoms do not develop -isolate 7 days if you are tested and remain asymptomatic -You do not necessarily need to be tested for COVID if you have + exposure and        develop   symptoms. Just isolate at home x10 days from symptom onset During   this global pandemic, CDC advises to practice social distancing, try to stay at least 53ft away from others at all times. Wear a face covering. Wash and sanitize your hands regularly and avoid going anywhere that is not necessary.  KEEP IN MIND THAT THE COVID TEST IS NOT 100% ACCURATE AND YOU SHOULD STILL DO EVERYTHING TO PREVENT POTENTIAL SPREAD OF VIRUS TO OTHERS (WEAR MASK, WEAR GLOVES, WASH HANDS AND  SANITIZE REGULARLY). IF INITIAL TEST IS NEGATIVE, THIS MAY NOT MEAN YOU ARE DEFINITELY NEGATIVE. MOST ACCURATE TESTING IS DONE 5-7 DAYS AFTER EXPOSURE.   It is not advised by CDC to get re-tested after receiving a positive COVID test since you can still test positive for weeks to months after you have already cleared the virus.   *If you have not been vaccinated for COVID, I strongly suggest you consider getting vaccinated as long as there are no contraindications.    Your blood pressure is elevated in clinic today please continue to follow-up with your PCP regarding control of your blood pressure.  Try to keep a check on it at home and follow-up with your PCP when you have a recorded log of the blood pressures.

## 2020-08-08 NOTE — ED Triage Notes (Signed)
Pt reports having cough over 1 week. covid exposure and would like to be tested.

## 2021-01-12 IMAGING — CR DG FINGER THUMB 2+V*L*
4 series · 4 of 4 positions shown · non-contrast
Comparison: None.

CLINICAL DATA: Left thumb pain and swelling.  Fall last night.

EXAM:
LEFT THUMB 2+V

[finger ap (1 of 2)]
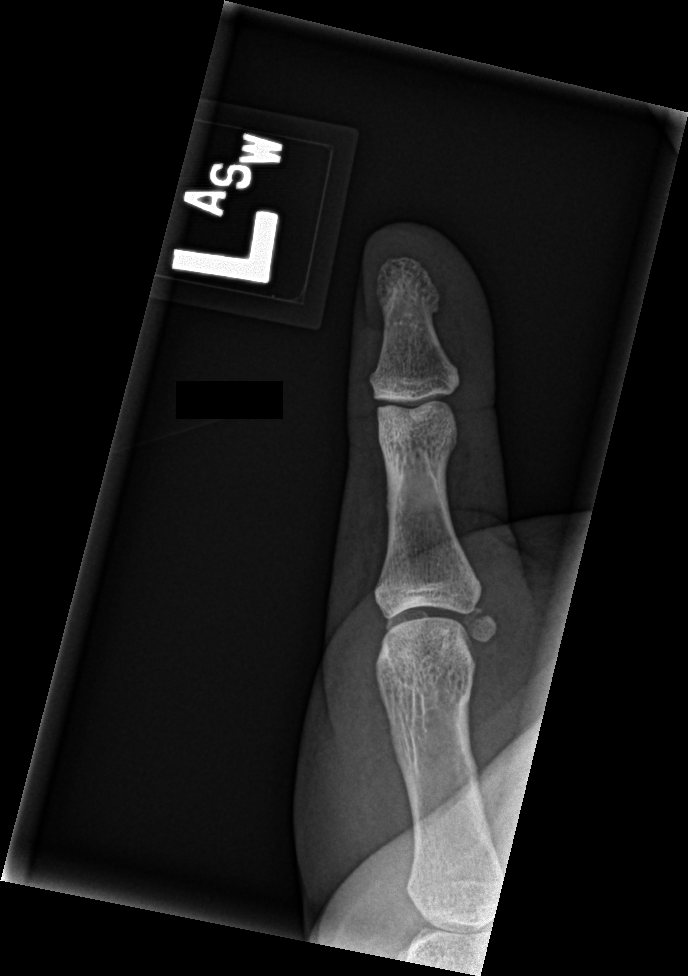

[finger obl]
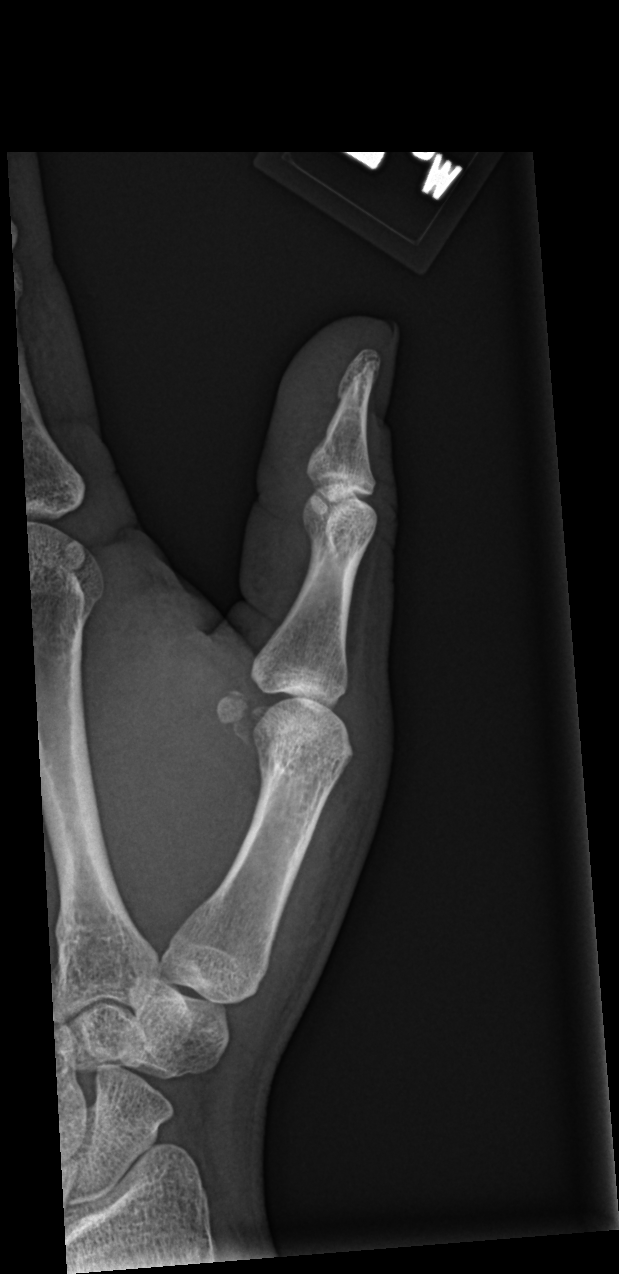

[finger lat]
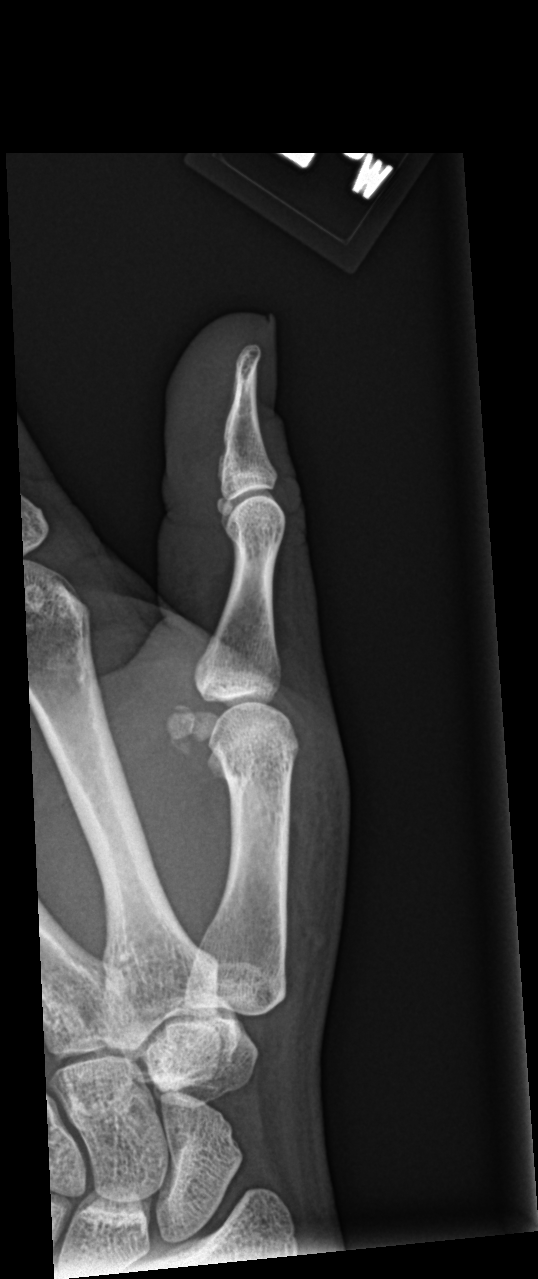

[finger ap (2 of 2)]
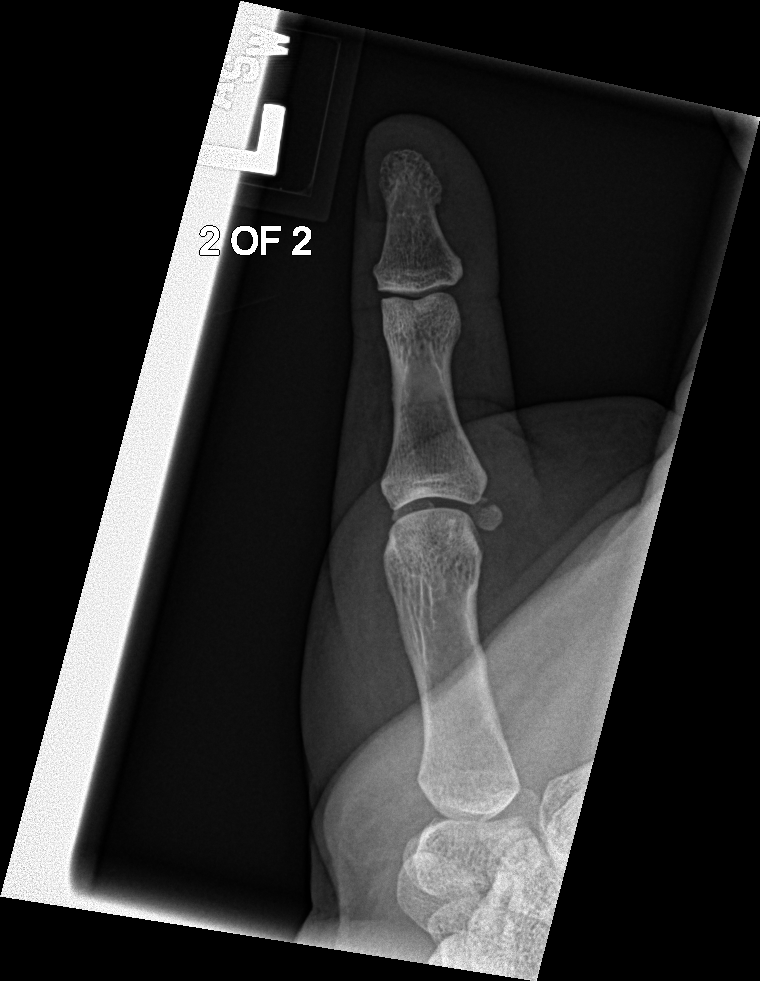

[4 of 4 positions shown; findings below may reference images not displayed]

FINDINGS: There is a mildly displaced fracture of the ulnar sesamoid bone at
the first MCP joint. The first metacarpal and phalanges of the thumb
appear intact. There is no dislocation. Mild soft tissue swelling is
noted about the first MCP joint.
IMPRESSION: Fracture of the ulnar sesamoid of the thumb.
